# Patient Record
Sex: Female | Born: 1975 | Race: White | Hispanic: Yes | Marital: Married | Smoking: Never smoker
Health system: Southern US, Community
[De-identification: ages and names within clinical notes are randomized; demographics above are authoritative.]

## PROBLEM LIST (undated history)

## (undated) DIAGNOSIS — E119 Type 2 diabetes mellitus without complications: Secondary | ICD-10-CM

## (undated) HISTORY — PX: OVARIAN CYST REMOVAL: SHX89

## (undated) HISTORY — DX: Type 2 diabetes mellitus without complications: E11.9

---

## 2008-03-26 ENCOUNTER — Emergency Department: Payer: Self-pay

## 2009-01-23 ENCOUNTER — Ambulatory Visit: Payer: Self-pay | Admitting: Certified Nurse Midwife

## 2010-07-14 IMAGING — US US PELV - US TRANSVAGINAL
1 series · 17 of 25 positions shown · non-contrast
Comparison: none

REASON FOR EXAM: menorrhagia
COMMENTS:

[Series 1: us pelv - us transvaginal · 17 of 64 slices shown]
[im 1/64]
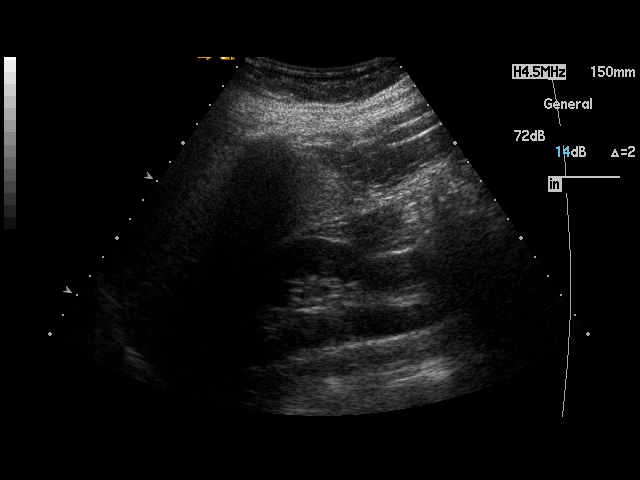
[im 6/64]
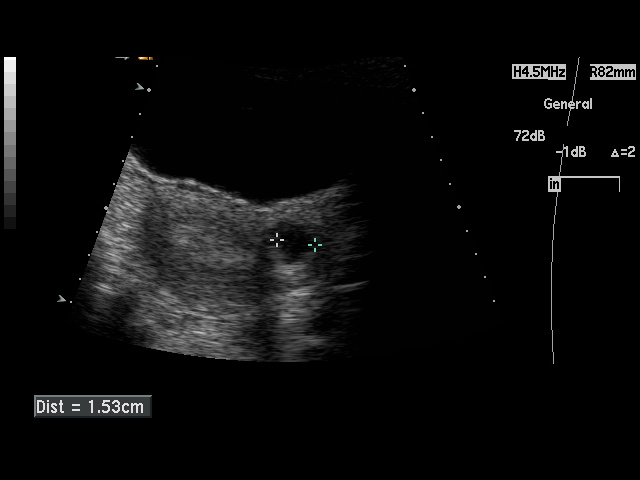
[im 8/64]
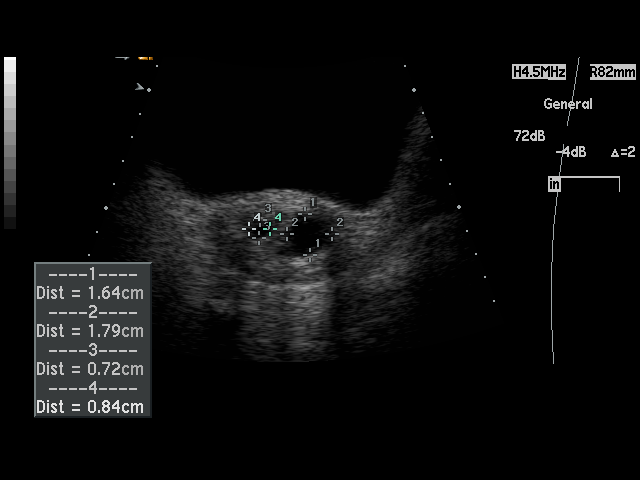
[im 14/64]
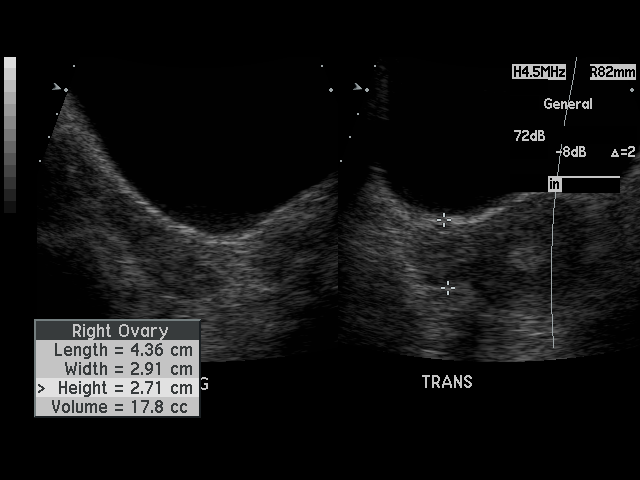
[im 16/64]
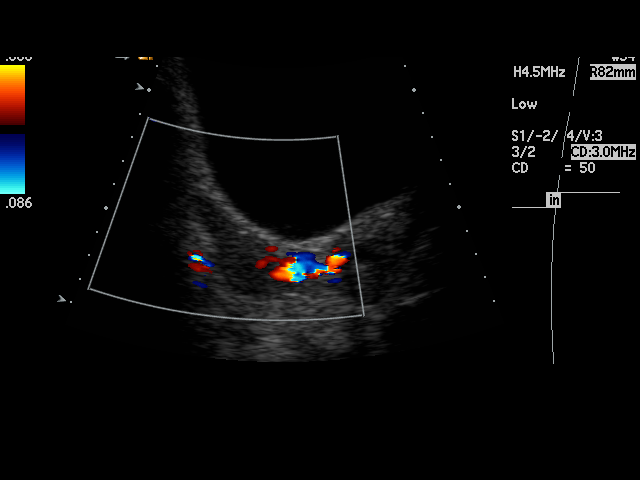
[im 22/64]
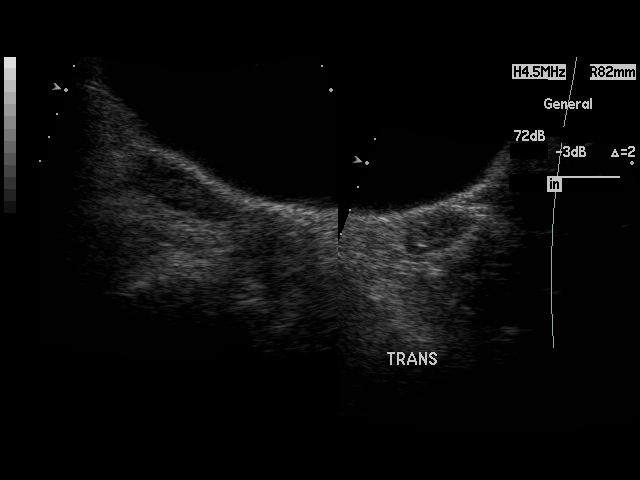
[im 24/64]
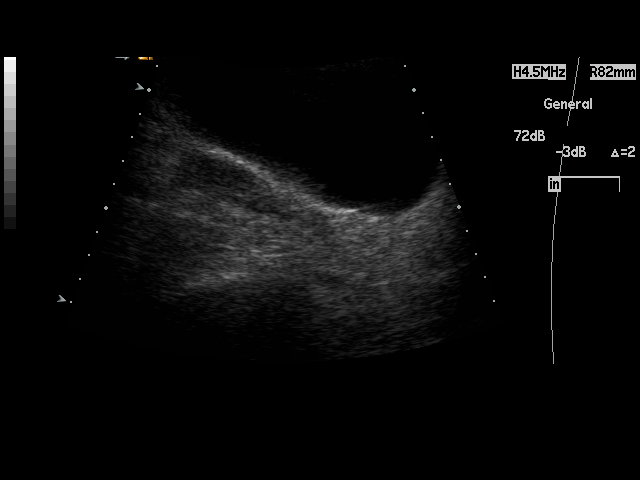
[im 29/64]
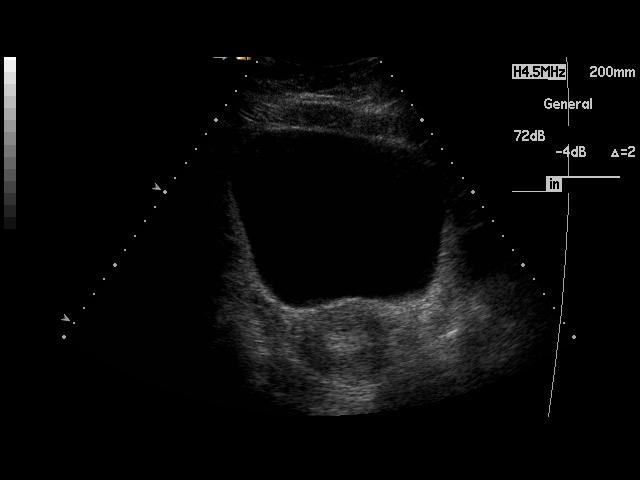
[im 32/64]
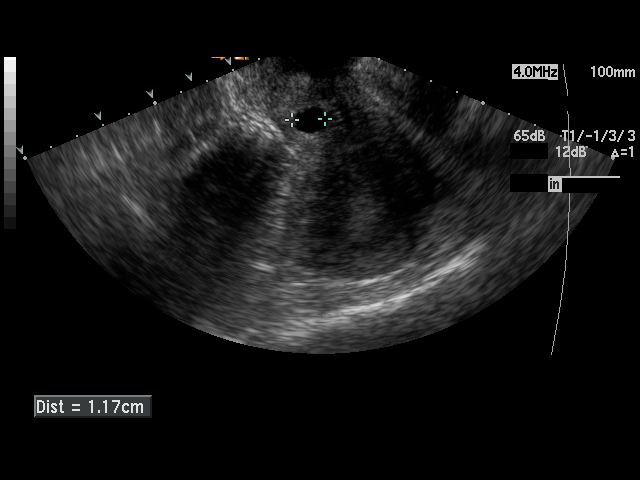
[im 35/64]
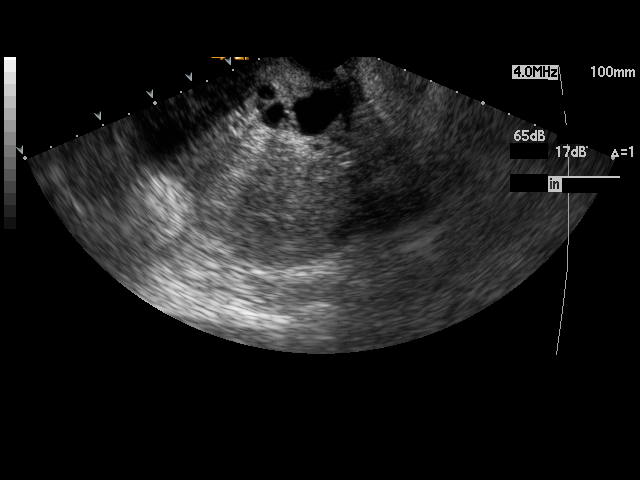
[im 40/64]
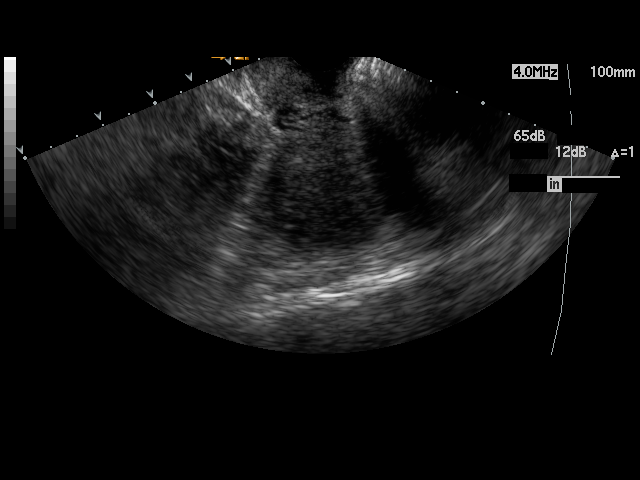
[im 43/64]
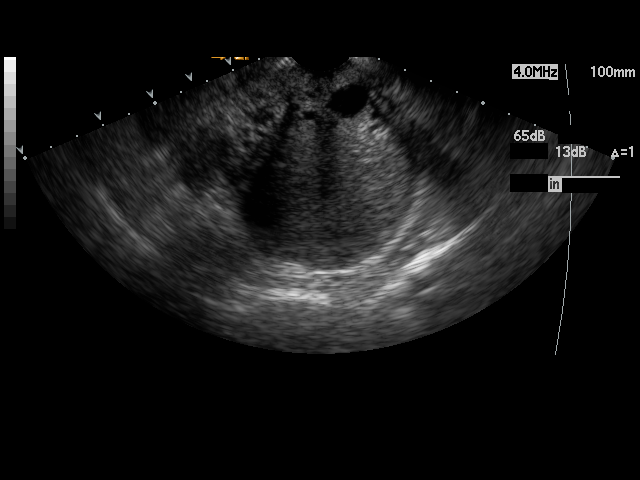
[im 48/64]
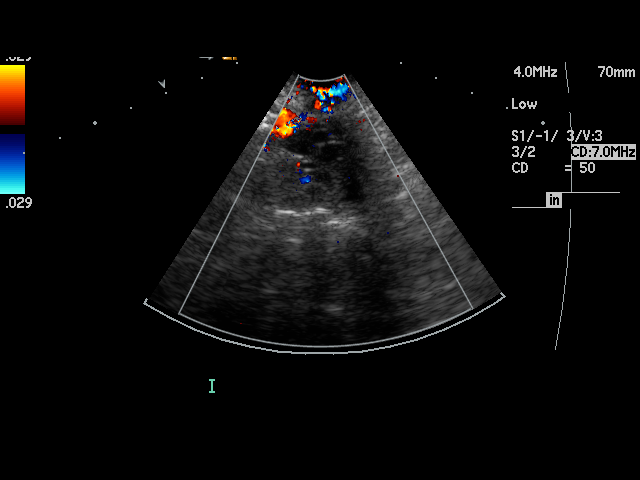
[im 50/64]
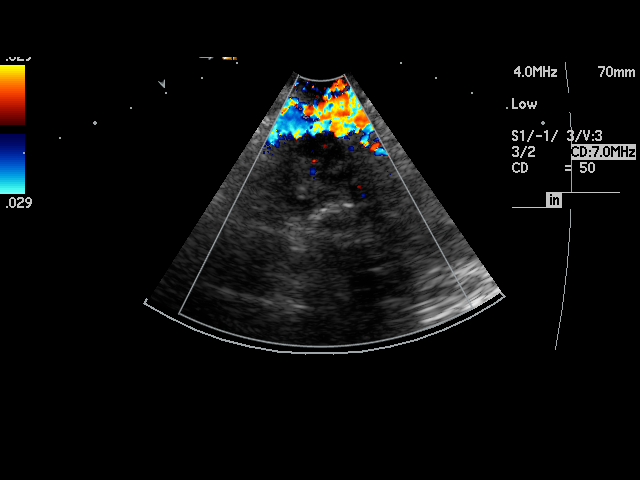
[im 56/64]
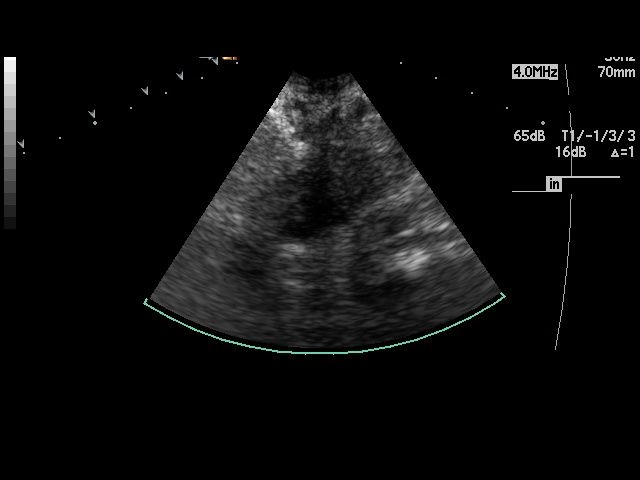
[im 58/64]
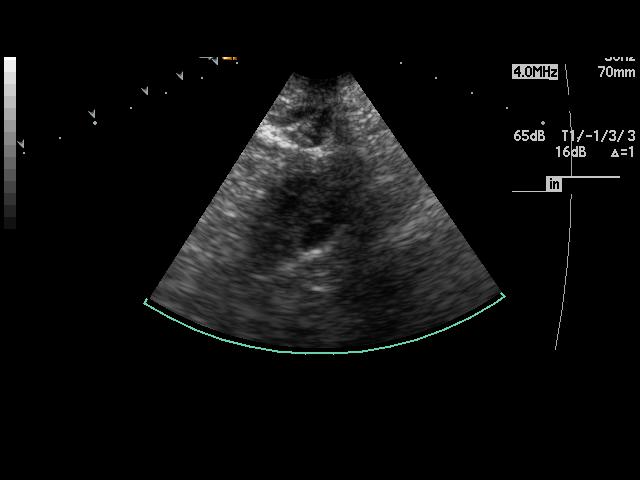
[im 64/64]
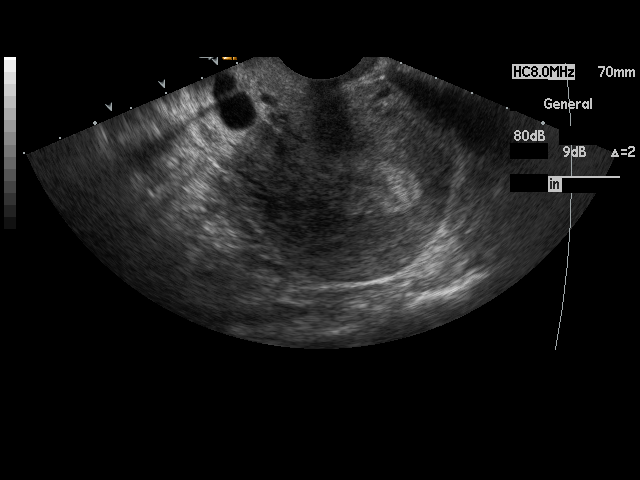

[17 of 25 positions shown; findings below may reference images not displayed]

PROCEDURE:     US  - US PELVIS EXAM W/TRANSVAGINAL  - January 23, 2009 [DATE]

RESULT:     Transabdominal and endovaginal ultrasound was performed.

The uterus measures 7.76 cm x 6.09 cm x 4.27 cm. A few Nabothian cysts are
seen with the larger measuring 2.1 cm in diameter. No other uterine mass
lesions are seen. The endometrium measures 1.69 cm in thickness. The right
and left ovaries are visualized. The right ovary measures 3.38 cm at maximum
diameter. The left ovary measures 3.6 cm at maximum diameter. A few
follicular cysts are noted bilaterally. No abnormal adnexal masses are seen.
No free fluid is seen in the pelvis. The visualized portion of the urinary
bladder is normal in appearance. The kidneys show no hydronephrosis.
IMPRESSION: 1.  No significant abnormalities are identified.
2.  A few Nabothian cysts are observed.
3.  No abnormal adnexal masses are seen.

## 2020-03-07 ENCOUNTER — Ambulatory Visit: Payer: Self-pay

## 2020-03-07 ENCOUNTER — Other Ambulatory Visit: Payer: Self-pay

## 2020-03-07 ENCOUNTER — Ambulatory Visit (LOCAL_COMMUNITY_HEALTH_CENTER): Payer: Self-pay | Admitting: Advanced Practice Midwife

## 2020-03-07 ENCOUNTER — Encounter: Payer: Self-pay | Admitting: Student

## 2020-03-07 VITALS — BP 152/83 | HR 91 | Ht 66.0 in | Wt 235.6 lb

## 2020-03-07 DIAGNOSIS — Z3009 Encounter for other general counseling and advice on contraception: Secondary | ICD-10-CM

## 2020-03-07 DIAGNOSIS — Z550 Illiteracy and low-level literacy: Secondary | ICD-10-CM

## 2020-03-07 DIAGNOSIS — E669 Obesity, unspecified: Secondary | ICD-10-CM

## 2020-03-07 DIAGNOSIS — Z113 Encounter for screening for infections with a predominantly sexual mode of transmission: Secondary | ICD-10-CM

## 2020-03-07 DIAGNOSIS — R030 Elevated blood-pressure reading, without diagnosis of hypertension: Secondary | ICD-10-CM

## 2020-03-07 DIAGNOSIS — Z01419 Encounter for gynecological examination (general) (routine) without abnormal findings: Secondary | ICD-10-CM

## 2020-03-07 DIAGNOSIS — Z111 Encounter for screening for respiratory tuberculosis: Secondary | ICD-10-CM

## 2020-03-07 LAB — WET PREP FOR TRICH, YEAST, CLUE
Trichomonas Exam: NEGATIVE
Yeast Exam: NEGATIVE

## 2020-03-07 NOTE — Progress Notes (Signed)
[facility] [address] Main Number: [phone]    Family Planning Visit- Initial Visit  Subjective:  Kaitlyn Acosta is a 44 y.o. MHF G2P0020 nonsmoker  being seen today for an initial well woman visit.  She is currently using None for pregnancy prevention. Patient reports she does want a pregnancy in the next year.  Patient has the following medical conditions has Obesity BMI=38.0 and Elevated blood pressure reading 03/07/20  152/83 on their problem list.  Chief Complaint  Patient presents with  . Annual Exam    PAP    Patient reports wants to conceive and is actively trying.  LMP 02/18/20.  Last sex 03/04/20 without condom; with current partner x 13 years.  Last ETOH 03/1999.   Here in [place] x 22 years and "dealing with immigration".  6th grade education.  Living with husband and her mom.  Unemployed.   152/83.  Last pap 06/03/2016 neg.  Patient denies cigs  Body mass index is 38.03 kg/m. - Patient is eligible for diabetes screening based on BMI and age >38?  yes HA1C ordered? yes  Patient reports 1 of partners in last year. Desires STI screening?  No - with monogomous partner x 13 years  Has patient been screened once for HCV in the past?  No  No results found for: HCVAB  Does the patient have current drug use (including MJ), have a partner with drug use, and/or has been incarcerated since last result? No  If yes-- Screen for HCV through [facility]   Does the patient meet criteria for HBV testing? No  Criteria:  -Household, sexual or needle sharing contact with HBV -History of drug use -HIV positive -Those with known Hep C   Health Maintenance Due  Topic Date Due  . Hepatitis C Screening  Never done  . COVID-19 Vaccine (1) Never done  . HIV Screening  Never done  . TETANUS/TDAP  Never done  . PAP SMEAR-Modifier  Never done  . INFLUENZA VACCINE  Never done    Review of Systems  Eyes:  Positive for blurred vision (never seen eye doctor; blurry vision).  Neurological: Positive for headaches (1-3x/mo relieved with Ibuprofen on different places on head, -N&V).  All other systems reviewed and are negative.   The following portions of the patient's history were reviewed and updated as appropriate: allergies, current medications, past family history, past medical history, past social history, past surgical history and problem list. Problem list updated.   See flowsheet for other program required questions.  Objective:   Vitals:   03/07/20 1402  BP: (!) 152/83  Pulse: 91  Weight: 235 lb 9.6 oz (106.9 kg)  Height: 5\' 6"  (1.676 m)    Physical Exam Constitutional:      Appearance: Normal appearance. She is obese.  HENT:     Head: Normocephalic and atraumatic.     Mouth/Throat:     Mouth: Mucous membranes are moist.  Eyes:     Conjunctiva/sclera: Conjunctivae normal.  Cardiovascular:     Rate and Rhythm: Normal rate and regular rhythm.  Pulmonary:     Effort: Pulmonary effort is normal.     Breath sounds: Normal breath sounds.  Chest:     Breasts:        Right: Normal.        Left: Normal.  Abdominal:     Palpations: Abdomen is soft.     Comments: Soft, poor tone, increased adipose  Genitourinary:    General: Normal vulva.     Exam position: Lithotomy position.     Vagina: Bleeding (moderate red menses blood) present.     Cervix: Normal.     Uterus: Normal.      Adnexa: Right adnexa normal and left adnexa normal.     Rectum: Normal.  Musculoskeletal:        General: Normal range of motion.     Cervical back: Normal range of motion and neck supple.  Skin:    General: Skin is warm and dry.  Neurological:     Mental Status: She is alert.  Psychiatric:        Mood and Affect: Mood normal.       Assessment and Plan:  Kaitlyn Acosta is a 44 y.o. female presenting to the [facility] Department for an initial well woman  exam/family planning visit  Contraception counseling: Reviewed all forms of birth control options in the tiered based approach. available including abstinence; over the counter/barrier methods; hormonal contraceptive medication including pill, patch, ring, injection,contraceptive implant, ECP; hormonal and nonhormonal IUDs; permanent sterilization options including vasectomy and the various tubal sterilization modalities. Risks, benefits, and typical effectiveness rates were reviewed.  Questions were answered.  Written information was also given to the patient to review.  Patient desires pregnancy, this was prescribed for patient. She will follow up in 1 year for surveillance.  She was told to call with any further questions, or with any concerns about this method of contraception.  Emphasized use of condoms 100% of the time for STI prevention.  Patient was offered ECP. ECP was not accepted by the patient. ECP counseling was not given - see RN documentation  1. Obesity, unspecified classification, unspecified obesity type, unspecified whether serious comorbidity present HgbA1C today - WET PREP FOR TRICH, YEAST, CLUE  2. Elevated blood pressure reading 03/07/20  152/83 Referred to primary care MD  3. Family planning Please give BCCCP # to pt to schedule mammogram (never had) Please give primary care MD list to pt  4. Well woman exam with routine gynecological exam Pt does not want birth control.  She wants to conceive - IGP, Aptima HPV - Hgb A1c w/o eAG     Return in about 1 year (around 03/07/2021).  Future Appointments  Date Time Provider Department Center  03/10/2020  8:55 AM [facility] NURSE [facility] None    Alberteen Spindle, CNM

## 2020-03-07 NOTE — Progress Notes (Signed)
Presents for PE and Pap. Declines STD screen and bloodwork. PCP list given.  Sharlyne Pacas, RN

## 2020-03-08 ENCOUNTER — Telehealth: Payer: Self-pay

## 2020-03-08 LAB — HGB A1C W/O EAG: Hgb A1c MFr Bld: 6.6 % — ABNORMAL HIGH (ref 4.8–5.6)

## 2020-03-08 NOTE — Telephone Encounter (Signed)
Call to client as per provider order and counseled regarding elevated hgb A1c and need for PCP evaluation. Per client, received [facility] info sheet at appt yesterday and counseled regarding [facility] for primary care. Client sent text regarding My Chart activation per her request. Salli Real, [facility] interpreter, assisted with call. Jossie Ng, RN

## 2020-03-10 ENCOUNTER — Other Ambulatory Visit: Payer: Self-pay

## 2020-03-10 ENCOUNTER — Ambulatory Visit (LOCAL_COMMUNITY_HEALTH_CENTER): Payer: Self-pay

## 2020-03-10 DIAGNOSIS — Z111 Encounter for screening for respiratory tuberculosis: Secondary | ICD-10-CM

## 2020-03-10 LAB — IGP, APTIMA HPV
HPV Aptima: NEGATIVE
PAP Smear Comment: 0

## 2020-03-10 LAB — TB SKIN TEST
Induration: 0 mm
TB Skin Test: NEGATIVE
